# Patient Record
Sex: Male | Born: 1965 | Race: White | Hispanic: No | Marital: Married | Smoking: Former smoker
Health system: Southern US, Community
[De-identification: ages and names within clinical notes are randomized; demographics above are authoritative.]

## PROBLEM LIST (undated history)

## (undated) DIAGNOSIS — E119 Type 2 diabetes mellitus without complications: Secondary | ICD-10-CM

## (undated) DIAGNOSIS — C801 Malignant (primary) neoplasm, unspecified: Secondary | ICD-10-CM

## (undated) DIAGNOSIS — I10 Essential (primary) hypertension: Secondary | ICD-10-CM

## (undated) HISTORY — PX: COLON SURGERY: SHX602

---

## 2011-09-03 ENCOUNTER — Emergency Department: Payer: Self-pay | Admitting: Emergency Medicine

## 2012-03-30 ENCOUNTER — Ambulatory Visit: Payer: Self-pay

## 2012-03-30 LAB — DOT URINE DIP
Protein: NEGATIVE
Specific Gravity: 1.025 (ref 1.003–1.030)

## 2012-06-27 ENCOUNTER — Ambulatory Visit: Payer: Self-pay | Admitting: Medical

## 2012-06-27 LAB — DOT URINE DIP
Glucose,UR: NEGATIVE mg/dL (ref 0–75)
Specific Gravity: 1.01 (ref 1.003–1.030)

## 2014-04-05 ENCOUNTER — Ambulatory Visit: Payer: Self-pay | Admitting: Family Medicine

## 2014-05-02 ENCOUNTER — Ambulatory Visit: Payer: Self-pay | Admitting: Family Medicine

## 2020-03-07 ENCOUNTER — Emergency Department: Payer: BC Managed Care – PPO

## 2020-03-07 ENCOUNTER — Emergency Department
Admission: EM | Admit: 2020-03-07 | Discharge: 2020-03-08 | Disposition: A | Discharge status: Home / Self Care | Payer: BC Managed Care – PPO | Attending: Emergency Medicine | Admitting: Emergency Medicine

## 2020-03-07 ENCOUNTER — Other Ambulatory Visit: Payer: Self-pay

## 2020-03-07 DIAGNOSIS — I10 Essential (primary) hypertension: Secondary | ICD-10-CM | POA: Diagnosis not present

## 2020-03-07 DIAGNOSIS — R0602 Shortness of breath: Secondary | ICD-10-CM | POA: Insufficient documentation

## 2020-03-07 DIAGNOSIS — R0789 Other chest pain: Secondary | ICD-10-CM | POA: Insufficient documentation

## 2020-03-07 DIAGNOSIS — E119 Type 2 diabetes mellitus without complications: Secondary | ICD-10-CM | POA: Insufficient documentation

## 2020-03-07 DIAGNOSIS — R079 Chest pain, unspecified: Secondary | ICD-10-CM

## 2020-03-07 DIAGNOSIS — Z87891 Personal history of nicotine dependence: Secondary | ICD-10-CM | POA: Diagnosis not present

## 2020-03-07 HISTORY — DX: Essential (primary) hypertension: I10

## 2020-03-07 HISTORY — DX: Malignant (primary) neoplasm, unspecified: C80.1

## 2020-03-07 HISTORY — DX: Type 2 diabetes mellitus without complications: E11.9

## 2020-03-07 LAB — BASIC METABOLIC PANEL
Anion gap: 11 (ref 5–15)
BUN: 14 mg/dL (ref 6–20)
CO2: 26 mmol/L (ref 22–32)
Calcium: 9.1 mg/dL (ref 8.9–10.3)
Chloride: 101 mmol/L (ref 98–111)
Creatinine, Ser: 1.11 mg/dL (ref 0.61–1.24)
GFR calc Af Amer: >60 mL/min (ref >60)
GFR calc non Af Amer: >60 mL/min (ref >60)
Glucose, Bld: 144 mg/dL — ABNORMAL HIGH (ref 70–99)
Potassium: 3.6 mmol/L (ref 3.5–5.1)
Sodium: 138 mmol/L (ref 135–145)

## 2020-03-07 LAB — CBC
HCT: 44.2 % (ref 39.0–52.0)
Hemoglobin: 15.2 g/dL (ref 13.0–17.0)
MCH: 31.9 pg (ref 26.0–34.0)
MCHC: 34.4 g/dL (ref 30.0–36.0)
MCV: 92.7 fL (ref 80.0–100.0)
Platelets: 190 10*3/uL (ref 150–400)
RBC: 4.77 MIL/uL (ref 4.22–5.81)
RDW: 12.7 % (ref 11.5–15.5)
WBC: 8.4 10*3/uL (ref 4.0–10.5)
nRBC: 0 % (ref 0.0–0.2)

## 2020-03-07 LAB — TROPONIN I (HIGH SENSITIVITY): Troponin I (High Sensitivity): 5 ng/L (ref <18)

## 2020-03-07 NOTE — ED Provider Notes (Signed)
[facility] Emergency Department Provider Note  ____________________________________________   First MD Initiated Contact with Patient 03/07/20 2334     (approximate)  I have reviewed the triage vital signs and the nursing notes.   HISTORY  Chief Complaint Chest Pain and Shortness of Breath   HPI Wesley Gilbert is a 54 y.o. male   with below list of previous medical conditions including hypertension diabetes hyperlipidemia and colon cancer presents to the emergency department secondary to cute onset of chest pain with radiation to the left arm with associated dyspnea that began yesterday.  Patient states that the pain began while he was working.  Patient states he was walking back and forth approximately 40 to 50 feet something that he has done regularly without any difficulty.  Patient states that while doing so yesterday he started to experience the beforementioned symptoms.  Patient denies any discomfort at present.  Patient denies any lower extremity pain or swelling.  Patient denies any fever or cough.       Past Medical History:  Diagnosis Date  . Cancer (HCC)    hx of colon cancer  . Diabetes mellitus without complication ([place])   . Hypertension     There are no problems to display for this patient.   Past Surgical History:  Procedure Laterality Date  . COLON SURGERY      Prior to Admission medications   Not on File    Allergies Doxycycline and Tetracyclines & related  History reviewed. No pertinent family history.  Social History Social History   Tobacco Use  . Smoking status: Former Smoker    Quit date: 03/08/2007    Years since quitting: 13.0  . Smokeless tobacco: Never Used  Substance Use Topics  . Alcohol use: Yes  . Drug use: Never    Review of Systems Constitutional: No fever/chills Eyes: No visual changes. ENT: No sore throat. Cardiovascular: Positive for chest pain. Respiratory: Positive for shortness of  breath. Gastrointestinal: No abdominal pain.  No nausea, no vomiting.  No diarrhea.  No constipation. Genitourinary: Negative for dysuria. Musculoskeletal: Negative for neck pain.  Negative for back pain. Integumentary: Negative for rash. Neurological: Negative for headaches, focal weakness or numbness.  ____________________________________________   PHYSICAL EXAM:  VITAL SIGNS: ED Triage Vitals  Enc Vitals Group     BP 03/07/20 2144 (!) 141/94     Pulse Rate 03/07/20 2144 98     Resp 03/07/20 2144 20     Temp 03/07/20 2144 98.2 F (36.8 C)     Temp Source 03/07/20 2144 Oral     SpO2 03/07/20 2144 97 %     Weight 03/07/20 2146 108.9 kg (240 lb)     Height 03/07/20 2146 1.753 m (5\' 9" )     Head Circumference --      Peak Flow --      Pain Score 03/07/20 2145 2     Pain Loc --      Pain Edu? --      Excl. in [place]? --     Constitutional: Alert and oriented.  Eyes: Conjunctivae are normal.  Mouth/Throat: Patient is wearing a mask. Neck: No stridor.  No meningeal signs.   Cardiovascular: Normal rate, regular rhythm. Good peripheral circulation. Grossly normal heart sounds. Respiratory: Normal respiratory effort.  No retractions. Gastrointestinal: Soft and nontender. No distention.  Musculoskeletal: No lower extremity tenderness nor edema. No gross deformities of extremities. Neurologic:  Normal speech and language. No gross focal neurologic  deficits are appreciated.  Skin:  Skin is warm, dry and intact. Psychiatric: Mood and affect are normal. Speech and behavior are normal.  ____________________________________________   LABS (all labs ordered are listed, but only abnormal results are displayed)  Labs Reviewed  BASIC METABOLIC PANEL - Abnormal; Notable for the following components:      Result Value   Glucose, Bld 144 (*)    All other components within normal limits  CBC  FIBRIN DERIVATIVES D-DIMER ([facility] ONLY)  TROPONIN I (HIGH SENSITIVITY)  TROPONIN I (HIGH  SENSITIVITY)   ____________________________________________  EKG  ED ECG REPORT I, At[place] N Caitlinn Klinker, the attending physician, personally viewed and interpreted this ECG.   Date: 03/08/2020  EKG Time: 9:41 PM  Rate: 92  Rhythm: Normal sinus rhythm  Axis: Normal  Intervals: Normal  ST&T Change: None  ____________________________________________  RADIOLOGY I, Stephen N Haden Cavenaugh, personally viewed and evaluated these images (plain radiographs) as part of my medical decision making, as well as reviewing the written report by the radiologist.  ED MD interpretation: No active disease noted on chest x-ray per radiologist.  CT PE protocol revealed no evidence of pulmonary emboli.  Or acute infiltrate.  Pulmonary nodule noted.  Official radiology report(s): DG Chest 2 View  Result Date: 03/07/2020 CLINICAL DATA:  Chest pain EXAM: CHEST - 2 VIEW COMPARISON:  None. FINDINGS: Heart and mediastinal contours are within normal limits. No focal opacities or effusions. No acute bony abnormality. IMPRESSION: No active cardiopulmonary disease. Electronically Signed   By: Rolm Baptise M.D.   On: 03/07/2020 22:08   CT Angio Chest PE W and/or Wo Contrast  Result Date: 03/08/2020 CLINICAL DATA:  Shortness of breath and chest pain. EXAM: CT ANGIOGRAPHY CHEST WITH CONTRAST TECHNIQUE: Multidetector CT imaging of the chest was performed using the standard protocol during bolus administration of intravenous contrast. Multiplanar CT image reconstructions and MIPs were obtained to evaluate the vascular anatomy. CONTRAST:  64mL OMNIPAQUE IOHEXOL 350 MG/ML SOLN COMPARISON:  None. FINDINGS: Cardiovascular: There is mild calcification of the aortic arch. Satisfactory opacification of the pulmonary arteries to the segmental level. No evidence of pulmonary embolism. Normal heart size. No pericardial effusion. Mediastinum/Nodes: No enlarged mediastinal, hilar, or axillary lymph nodes. Thyroid gland, trachea, and esophagus  demonstrate no significant findings. Lungs/Pleura: A 3 mm noncalcified lung nodule is seen within the anterolateral aspect of the right upper lobe. There is no evidence of acute infiltrate, pleural effusion or pneumothorax. Upper Abdomen: Multiple surgical sutures are seen within the gastric region. There is a small hiatal hernia. A 13.7 cm x 4.5 cm ventral hernia is seen along the midline of the upper abdomen. This contains fat and numerous nondilated loops of small bowel. Musculoskeletal: No chest wall abnormality. No acute or significant osseous findings. Review of the MIP images confirms the above findings. IMPRESSION: 1. No evidence of pulmonary embolism or acute infiltrate. 2. Large ventral hernia containing fat and numerous nondilated loops of small bowel. 3. Small hiatal hernia. 4. 3 mm noncalcified lung nodule within the right upper lobe. No follow-up needed if patient is low-risk. Non-contrast chest CT can be considered in 12 months if patient is high-risk. This recommendation follows the consensus statement: Guidelines for Management of Incidental Pulmonary Nodules Detected on CT Images: From the Fleischner Society 2017; Radiology 2017; [id number]:228-243. Aortic Atherosclerosis (ICD10-I70.0). Electronically Signed   By: Virgina Norfolk M.D.   On: 03/08/2020 00:56      Procedures   ____________________________________________   INITIAL IMPRESSION / MDM /  ASSESSMENT AND PLAN / ED COURSE  As part of my medical decision making, I reviewed the following data within the electronic MEDICAL RECORD NUMBER  54 year old male presented with above-stated history and physical exam a differential diagnosis including but not limited to ACS, stable angina, pulmonary emboli.  EKG revealed no evidence of ischemia or infarction.  Laboratory data including high-sensitivity troponin negative x2.  CT scan of the chest revealed no evidence of aortic dissection pulmonary emboli or any other intrathoracic acute pathology.   Pulmonary nodule was noted which patient is aware of.  In addition incidental finding on CT of ventral hernia which patient is also aware of.  Patient is pain-free at present.  I spoke with the patient and his wife at length regarding the necessity of following up with Dr. Saralyn Pilar cardiology today for possible outpatient stress test and further evaluation.  ____________________________________________  FINAL CLINICAL IMPRESSION(S) / ED DIAGNOSES  Final diagnoses:  Chest pain, unspecified type     MEDICATIONS GIVEN DURING THIS VISIT:  Medications  iohexol (OMNIPAQUE) 350 MG/ML injection 75 mL (75 mLs Intravenous Contrast Given 03/08/20 0027)  aspirin chewable tablet 324 mg (324 mg Oral Given 03/08/20 0201)     ED Discharge Orders    None      *Please note:  KIAI PEACE was evaluated in Emergency Department on 03/08/2020 for the symptoms described in the history of present illness. He was evaluated in the context of the global COVID-19 pandemic, which necessitated consideration that the patient might be at risk for infection with the SARS-CoV-2 virus that causes COVID-19. Institutional protocols and algorithms that pertain to the evaluation of patients at risk for COVID-19 are in a state of rapid change based on information released by regulatory bodies including the CDC and federal and state organizations. These policies and algorithms were followed during the patient's care in the ED.  Some ED evaluations and interventions may be delayed as a result of limited staffing during the pandemic.*  Note:  This document was prepared using Dragon voice recognition software and may include unintentional dictation errors.   Gregor Hams, MD 03/08/20 [phone]

## 2020-03-07 NOTE — ED Triage Notes (Signed)
Pt arrives to ED via POV with complaints of Chest pain/tightness that started yesterday and is reported to cross the whole chest. Pt states that with the chest pain he is experiencing shortness of breath, left arm pain. Pt reports hx of shoulder pain and back pain and states he believes complaint comes from this but wife made him come to ED. Pt is alert oriented x 4 and ambulatory. No distress noted.

## 2020-03-08 ENCOUNTER — Encounter: Payer: Self-pay | Admitting: Radiology

## 2020-03-08 ENCOUNTER — Emergency Department: Payer: BC Managed Care – PPO

## 2020-03-08 LAB — FIBRIN DERIVATIVES D-DIMER (ARMC ONLY): Fibrin derivatives D-dimer (ARMC): 319.81 ng/mL (FEU) (ref 0.00–499.00)

## 2020-03-08 LAB — TROPONIN I (HIGH SENSITIVITY): Troponin I (High Sensitivity): 4 ng/L (ref <18)

## 2020-03-08 MED ORDER — ASPIRIN 81 MG PO CHEW
324.0000 mg | CHEWABLE_TABLET | Freq: Once | ORAL | Status: AC
  Administered 2020-03-08: 02:00:00 324 mg via ORAL
  Filled 2020-03-08: qty 4

## 2020-03-08 MED ORDER — IOHEXOL 350 MG/ML SOLN
75.0000 mL | Freq: Once | INTRAVENOUS | Status: AC | PRN
  Administered 2020-03-08: 75 mL via INTRAVENOUS

## 2020-10-11 IMAGING — CR DG CHEST 2V
2 series · 2 of 2 positions shown · non-contrast
Comparison: None.

CLINICAL DATA: Chest pain

EXAM:
CHEST - 2 VIEW

[chest pa]
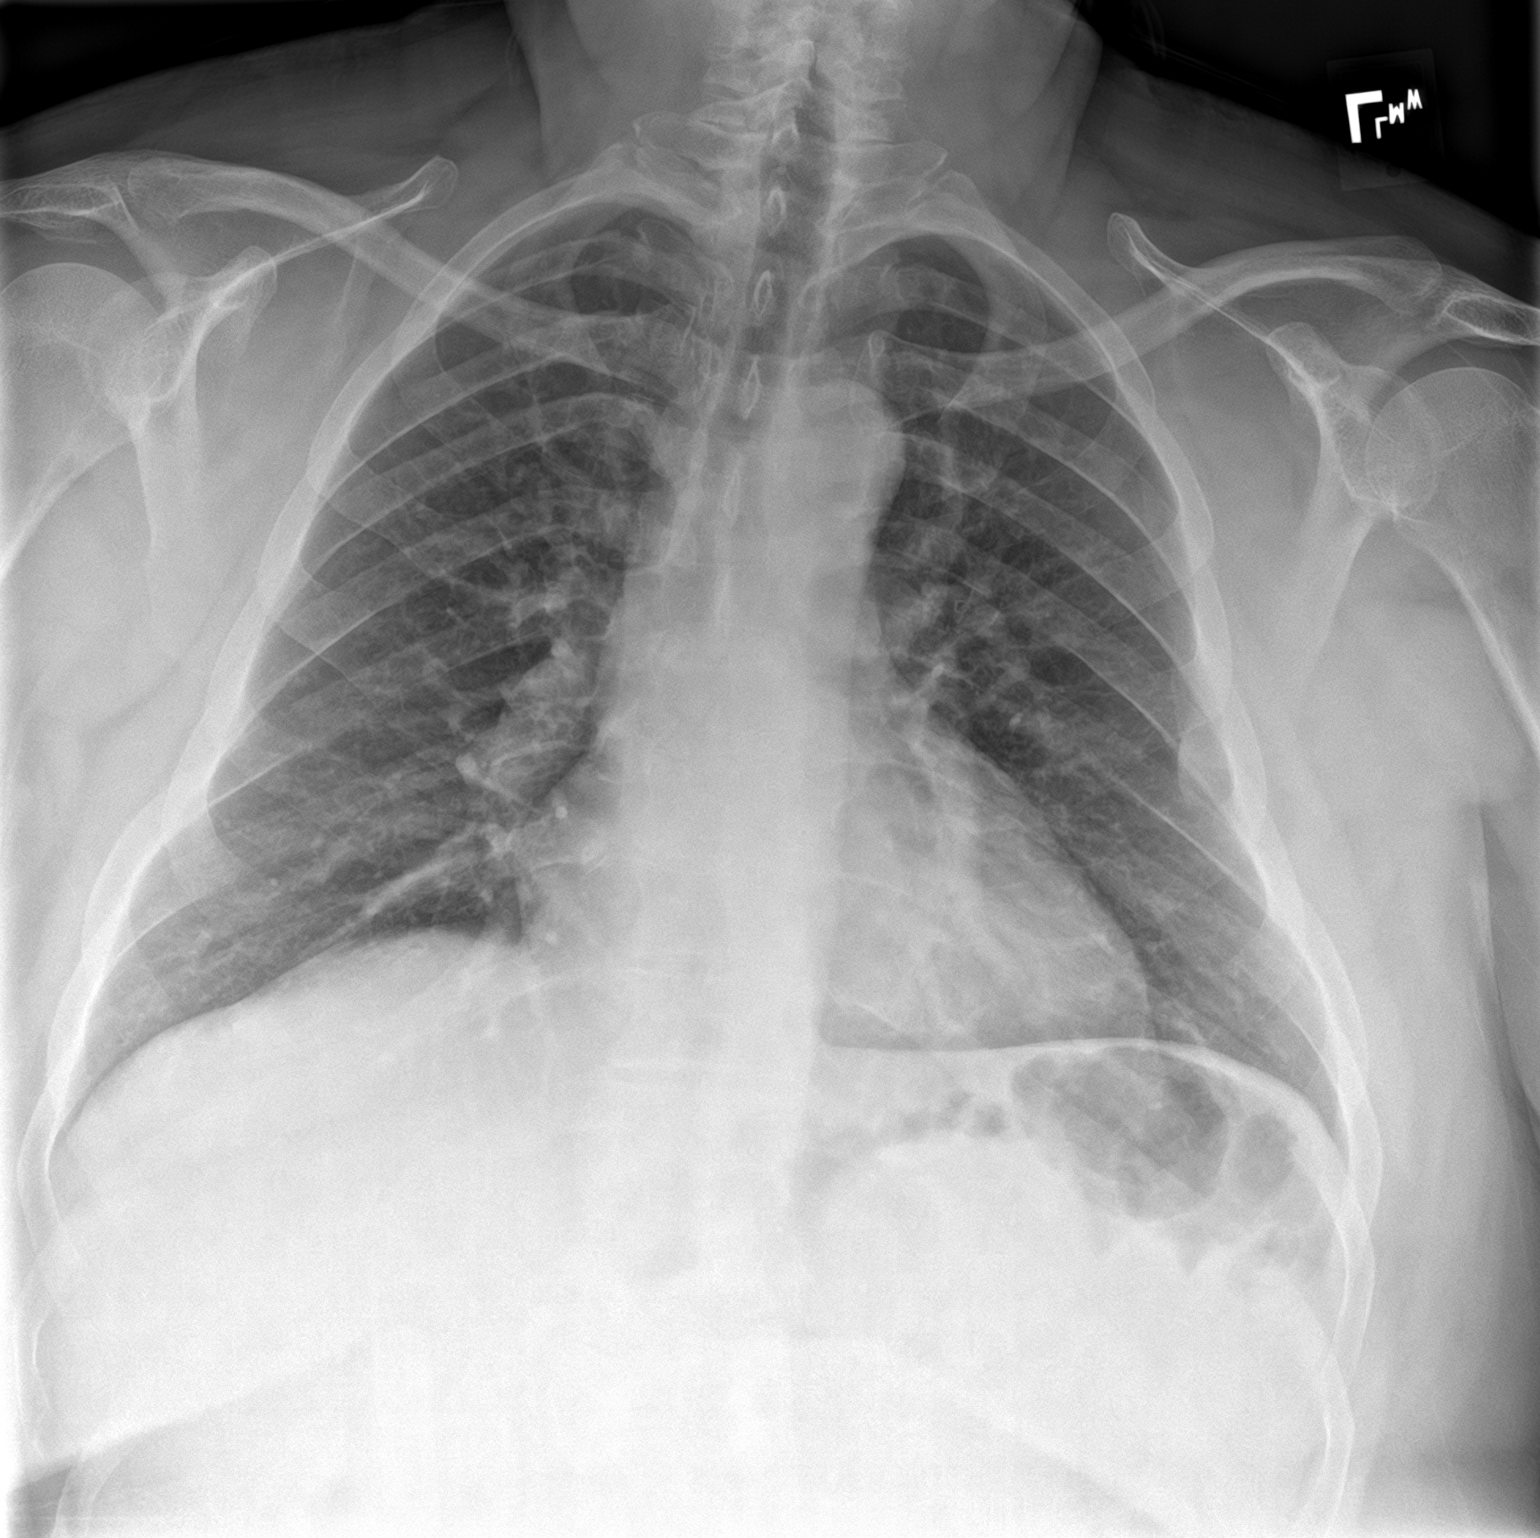

[chest lat]
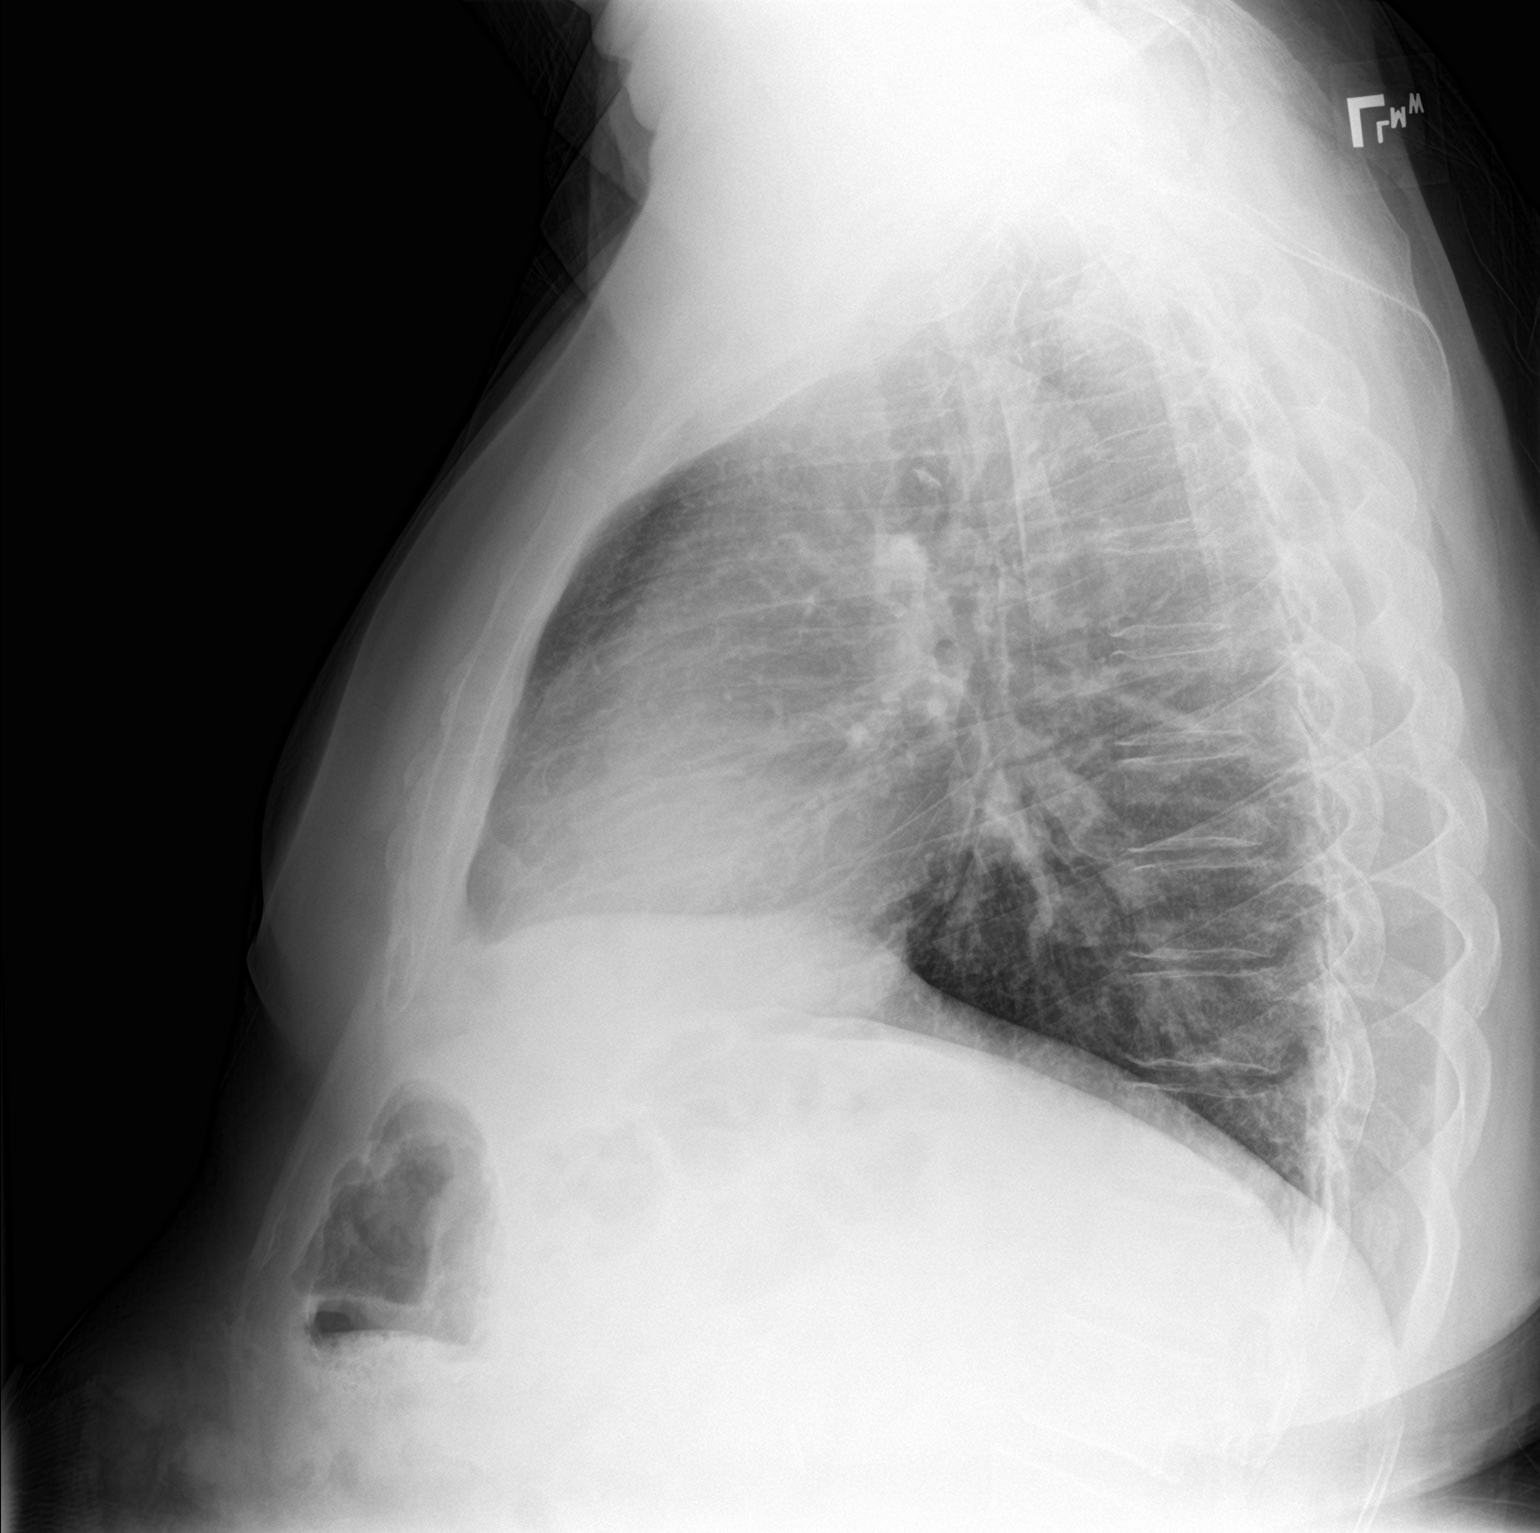

[2 of 2 positions shown; findings below may reference images not displayed]

FINDINGS: Heart and mediastinal contours are within normal limits. No focal
opacities or effusions. No acute bony abnormality.
IMPRESSION: No active cardiopulmonary disease.

## 2021-11-02 ENCOUNTER — Emergency Department
Admission: EM | Admit: 2021-11-02 | Discharge: 2021-11-03 | Disposition: A | Discharge status: Home / Self Care | Payer: BC Managed Care – PPO | Attending: Emergency Medicine | Admitting: Emergency Medicine

## 2021-11-02 DIAGNOSIS — R040 Epistaxis: Secondary | ICD-10-CM | POA: Insufficient documentation

## 2021-11-02 DIAGNOSIS — Z5321 Procedure and treatment not carried out due to patient leaving prior to being seen by health care provider: Secondary | ICD-10-CM | POA: Insufficient documentation

## 2021-11-03 ENCOUNTER — Encounter: Payer: Self-pay | Admitting: Emergency Medicine

## 2021-11-03 NOTE — ED Triage Notes (Signed)
Pt in with nosebleed since 7:30pm tonight. Pt denies any thinner use, only 81mg  ASA daily. Hypertensive PTA (180's/100's). Nose clamp applied in triage, bleeding more controlled
